# Patient Record
Sex: Male | Born: 1955 | Race: White | Hispanic: No | Marital: Married | Smoking: Never smoker
Health system: Southern US, Community
[De-identification: ages and names within clinical notes are randomized; demographics above are authoritative.]

## PROBLEM LIST (undated history)

## (undated) DIAGNOSIS — M1990 Unspecified osteoarthritis, unspecified site: Secondary | ICD-10-CM

## (undated) DIAGNOSIS — Z8719 Personal history of other diseases of the digestive system: Secondary | ICD-10-CM

## (undated) DIAGNOSIS — I10 Essential (primary) hypertension: Secondary | ICD-10-CM

## (undated) DIAGNOSIS — E785 Hyperlipidemia, unspecified: Secondary | ICD-10-CM

## (undated) DIAGNOSIS — K219 Gastro-esophageal reflux disease without esophagitis: Secondary | ICD-10-CM

## (undated) HISTORY — PX: NECK SURGERY: SHX720

## (undated) HISTORY — DX: Hyperlipidemia, unspecified: E78.5

## (undated) HISTORY — PX: FOOT SURGERY: SHX648

## (undated) HISTORY — PX: POLYPECTOMY: SHX149

## (undated) HISTORY — PX: COLONOSCOPY: SHX174

## (undated) HISTORY — PX: HERNIA REPAIR: SHX51

---

## 2000-03-01 ENCOUNTER — Emergency Department (HOSPITAL_COMMUNITY): Admission: EM | Admit: 2000-03-01 | Discharge: 2000-03-01 | Payer: Self-pay | Admitting: Emergency Medicine

## 2000-05-01 ENCOUNTER — Ambulatory Visit (HOSPITAL_COMMUNITY): Admission: RE | Admit: 2000-05-01 | Discharge: 2000-05-01 | Payer: Self-pay | Admitting: Neurosurgery

## 2000-05-01 ENCOUNTER — Encounter: Payer: Self-pay | Admitting: Neurosurgery

## 2000-05-16 ENCOUNTER — Encounter: Payer: Self-pay | Admitting: Neurosurgery

## 2000-05-16 ENCOUNTER — Ambulatory Visit (HOSPITAL_COMMUNITY): Admission: RE | Admit: 2000-05-16 | Discharge: 2000-05-16 | Payer: Self-pay | Admitting: Neurosurgery

## 2000-05-31 ENCOUNTER — Encounter: Payer: Self-pay | Admitting: Neurosurgery

## 2000-05-31 ENCOUNTER — Ambulatory Visit (HOSPITAL_COMMUNITY): Admission: RE | Admit: 2000-05-31 | Discharge: 2000-05-31 | Payer: Self-pay | Admitting: Neurosurgery

## 2005-09-27 ENCOUNTER — Ambulatory Visit (HOSPITAL_COMMUNITY): Admission: RE | Admit: 2005-09-27 | Discharge: 2005-09-27 | Payer: Self-pay | Admitting: Gastroenterology

## 2005-09-27 ENCOUNTER — Encounter (INDEPENDENT_AMBULATORY_CARE_PROVIDER_SITE_OTHER): Payer: Self-pay | Admitting: *Deleted

## 2009-08-27 ENCOUNTER — Encounter: Admission: RE | Admit: 2009-08-27 | Discharge: 2009-08-27 | Payer: Self-pay | Admitting: Internal Medicine

## 2009-09-17 ENCOUNTER — Ambulatory Visit (HOSPITAL_COMMUNITY): Admission: RE | Admit: 2009-09-17 | Discharge: 2009-09-17 | Payer: Self-pay | Admitting: Neurosurgery

## 2010-10-21 ENCOUNTER — Ambulatory Visit (HOSPITAL_COMMUNITY)
Admission: RE | Admit: 2010-10-21 | Discharge: 2010-10-21 | Disposition: A | Discharge status: Home / Self Care | Payer: Commercial Managed Care - PPO | Source: Ambulatory Visit | Attending: Gastroenterology | Admitting: Gastroenterology

## 2010-10-21 ENCOUNTER — Other Ambulatory Visit: Payer: Self-pay | Admitting: Gastroenterology

## 2010-10-21 DIAGNOSIS — D126 Benign neoplasm of colon, unspecified: Secondary | ICD-10-CM | POA: Insufficient documentation

## 2010-10-28 NOTE — Op Note (Signed)
  NAME:  [name], ECCHER NO.:  [id number]  MEDICAL RECORD NO.:  [id number]           PATIENT TYPE:  O  LOCATION:  WLEN                         FACILITY:  [facility]  PHYSICIAN:  Earle Gell, M.D.   DATE OF BIRTH:  1956/06/13  DATE OF PROCEDURE: DATE OF DISCHARGE:                              OPERATIVE REPORT   REFERRING PHYSICIAN:  Wenda Low, MD  HISTORY:  Mr. Erik Jones is a 55 year old male, born [birth year].  On September 27, 2005, the patient underwent his first screening colonoscopy and a diminutive neoplastic polyp was removed from his hepatic flexure and from the distal sigmoid colon.  The patient is scheduled to undergo a surveillance colonoscopy with polypectomy to prevent colon cancer.  ENDOSCOPIST:  Earle Gell, M.D.  PREMEDICATION:  Fentanyl 75 mcg, Versed 7 mg.  PROCEDURE:  After obtaining informed consent, the patient was placed in the left lateral decubitus position.  Anal inspection and digital rectal exam were normal.  The Pentax pediatric colonoscope was introduced into the rectum and easily advanced to the cecum.  A normal-appearing ileocecal valve and appendiceal orifice were identified.  Colonic preparation for the exam today was good.  Rectum normal.  Retroflexed view of the distal rectum normal. Sigmoid colon, normal. Descending colon, normal. Splenic flexure, normal. Transverse colon, normal. Hepatic flexure, normal. Ascending colon, normal. Cecum and ileocecal valve.  A 3-mm sessile polyp was removed from the cecum with cold biopsy forceps.  Assessment:  A small polyp was removed from the cecum with the cold biopsy forceps; otherwise normal surveillance colonoscopy.  RECOMMENDATIONS:  Repeat surveillance colonoscopy in 5 years.          ______________________________ Earle Gell, M.D.     MJ/MEDQ  D:  10/21/2010  T:  10/21/2010  Job:  [id number]  cc:   Wenda Low, MD Fax: [phone]  Electronically  Signed by Earle Gell M.D. on 10/28/2010 04:54:17 PM

## 2010-11-04 LAB — BASIC METABOLIC PANEL
BUN: 16 mg/dL (ref 6–23)
CO2: 30 mEq/L (ref 19–32)
Calcium: 9.5 mg/dL (ref 8.4–10.5)
Chloride: 99 mEq/L (ref 96–112)
Creatinine, Ser: 1.05 mg/dL (ref 0.4–1.5)
GFR calc Af Amer: >60 mL/min (ref >60)
GFR calc non Af Amer: >60 mL/min (ref >60)
Glucose, Bld: 118 mg/dL — ABNORMAL HIGH (ref 70–99)
Potassium: 4.4 mEq/L (ref 3.5–5.1)
Sodium: 137 mEq/L (ref 135–145)

## 2010-11-04 LAB — CBC
HCT: 44 % (ref 39.0–52.0)
Hemoglobin: 15.3 g/dL (ref 13.0–17.0)
MCHC: 34.8 g/dL (ref 30.0–36.0)
MCV: 93 fL (ref 78.0–100.0)
Platelets: 222 10*3/uL (ref 150–400)
RBC: 4.74 MIL/uL (ref 4.22–5.81)
RDW: 13 % (ref 11.5–15.5)
WBC: 7.1 10*3/uL (ref 4.0–10.5)

## 2010-12-31 NOTE — Op Note (Signed)
NAMELORA, Erik Jones NO.:  [id number]   MEDICAL RECORD NO.:  [id number]          PATIENT TYPE:  AMB   LOCATION:  ENDO                         FACILITY:  [facility]   PHYSICIAN:  Earle Gell, M.D.   DATE OF BIRTH:  [birth year]   DATE OF PROCEDURE:  09/27/2005  DATE OF DISCHARGE:                                 OPERATIVE REPORT   REFERRING PHYSICIAN:  Dr. Wenda Low   PROCEDURE:  Screening colonoscopy.   INDICATIONS:  Mr. Aariv Doerflein is a 55 year old male, born 04-14-1956.  Mr. Rama is scheduled to undergo his first screening colonoscopy  with polypectomy to prevent colon cancer.   ENDOSCOPIST:  Dr. Howell Rucks   PREMEDICATION:  1.  Versed 7 mg.  2.  Demerol 50 mg.   PROCEDURE:  After obtaining informed consent, Mr. Birke was placed in the  left lateral decubitus position.  I administered intravenous Demerol and  intravenous Versed to achieve conscious sedation for the procedure.  The  patient's blood pressure, oxygen saturation, and cardiac rhythm were  monitored throughout the procedure and documented in the medical record.   Anal inspection and digital rectal exam were normal.  The prostate was  nonnodular.  The Olympus adjustable pediatric colonoscope was introduced  into the rectum and advanced to the cecum.  A normal-appearing ileocecal  valve was intubated and the distal ileum inspected.  Colonic preparation for  the exam today was excellent.   Rectum normal.  I did not have enough room to perform a retroflexed view of  the distal rectum.  Sigmoid colon and descending colon.  From the distal sigmoid colon a 2 mm  sessile polyp was removed from a fold in the distal sigmoid colon.  Electrocautery snare was utilized.  Splenic flexure normal.  Transverse colon normal.  Hepatic flexure.  A 1-mm sessile polyp was removed with the cold biopsy  forceps.  Ascending colon normal.  Cecum and ileocecal valve normal.  Distal ileum normal.   ASSESSMENT:  A diminutive polyp was removed from the hepatic flexure with  the cold biopsy forceps; a 2-mm polyp was removed from the distal sigmoid  colon with the electrocautery snare.   RECOMMENDATIONS:  Await pathology report to determine when Mr. Trayer needs  a repeat screen for colon polyps.           ______________________________  Earle Gell, M.D.     MJ/MEDQ  D:  09/27/2005  T:  09/27/2005  Job:  [id number]   cc:   Wenda Low, MD  Fax: [phone]

## 2011-12-22 ENCOUNTER — Other Ambulatory Visit: Payer: Self-pay | Admitting: Internal Medicine

## 2011-12-23 ENCOUNTER — Ambulatory Visit
Admission: RE | Admit: 2011-12-23 | Discharge: 2011-12-23 | Disposition: A | Discharge status: Home / Self Care | Payer: Commercial Managed Care - PPO | Source: Ambulatory Visit | Attending: Internal Medicine | Admitting: Internal Medicine

## 2012-06-03 ENCOUNTER — Encounter (HOSPITAL_COMMUNITY): Payer: Self-pay | Admitting: Emergency Medicine

## 2012-06-03 ENCOUNTER — Emergency Department (INDEPENDENT_AMBULATORY_CARE_PROVIDER_SITE_OTHER)
Admission: EM | Admit: 2012-06-03 | Discharge: 2012-06-03 | Disposition: A | Discharge status: Home / Self Care | Payer: 59 | Source: Home / Self Care

## 2012-06-03 ENCOUNTER — Emergency Department (INDEPENDENT_AMBULATORY_CARE_PROVIDER_SITE_OTHER): Payer: 59

## 2012-06-03 DIAGNOSIS — IMO0002 Reserved for concepts with insufficient information to code with codable children: Secondary | ICD-10-CM

## 2012-06-03 HISTORY — DX: Essential (primary) hypertension: I10

## 2012-06-03 MED ORDER — CEPHALEXIN 500 MG PO CAPS: 500.0000 mg | ORAL_CAPSULE | Freq: Three times a day (TID) | ORAL | Status: DC

## 2012-06-03 MED ORDER — HYDROCODONE-ACETAMINOPHEN 5-325 MG PO TABS: 1.0000 | ORAL_TABLET | ORAL | Status: DC | PRN

## 2012-06-03 NOTE — Discharge Instructions (Signed)
Foreign Body  A foreign body is something in your body that should not be there. This may have been caused by a puncture wound or other injury. Puncture wounds become easily infected. This happens when bacteria (germs) get under the skin. Rusty nails and similar foreign bodies are often dirty and carry germs on them.   TREATMENT   · A foreign body is usually removed if this can be easily done right after it happens.  · Sometimes they are left in and removed at a later surgery. They may be left in indefinitely if they will not cause later problems.  · The following are general instructions in caring for your wound.  HOME CARE INSTRUCTIONS   · A dressing, depending on the location of the wound, may have been applied. This may be changed once per day or as instructed. If the dressing sticks, it may be soaked off with soapy water or hydrogen peroxide.  · Only take over-the-counter or prescription medicines for pain, discomfort, or fever as directed by your caregiver.  · Be aware that your body will work to remove the foreign substance. That is, the foreign body may work itself out of the wound. That is normal.  · You may have received a recommendation to follow up with your physician or a specialist. It is very important to call for or keep follow-up appointments in order to avoid infection or other complications.  SEEK IMMEDIATE MEDICAL CARE IF:   · There is redness, swelling, or increasing pain in the wound.  · You notice a foul smell coming from the wound or dressing.  · Pus is coming from the wound.  · An unexplained oral temperature above 102° F (38.9° C) develops, or as your caregiver suggests.  · There is increasing pain in the wound.  If you did not receive a tetanus shot today because you did not recall when your last one was given, check with your caregiver's office and determine if one is needed. Generally for a "dirty" wound, you should receive a tetanus booster if you have not had one in the last five  years. If you have a "clean" wound, you should receive a tetanus booster if you have not had one within the last ten years.  If you have a foreign body that needs removal and this was not done today, make sure you know how you are to follow up and what is the plan of action for taking care of this. It is your responsibility to follow up on this.  MAKE SURE YOU:   · Understand these instructions.  · Will watch your condition.  · Will get help right away if you are not doing well or get worse.  Document Released: 01/25/2001 Document Revised: 10/24/2011 Document Reviewed: 03/20/2008  ExitCare® Patient Information ©2013 [employer].

## 2012-06-03 NOTE — ED Notes (Signed)
Waiting discharge papers 

## 2012-06-03 NOTE — ED Provider Notes (Signed)
History     CSN: [id number]  Arrival date & time 06/03/12  1054   None     Chief Complaint  Patient presents with  . Foot Injury    (Consider location/radiation/quality/duration/timing/severity/associated sxs/prior treatment) HPI Comments: Today this 56 year old male stepped on a sewing needle broke off into his left foot, plantar aspect at the distal foot. Denies any other injury. The foreign body is not visible to the naked eye. There is no bleeding or discoloration.  Patient is a 56 y.o. male presenting with foot injury.  Foot Injury     Past Medical History  Diagnosis Date  . Hypertension     Past Surgical History  Procedure Date  . Hernia repair   . Neck surgery     History reviewed. No pertinent family history.  History  Substance Use Topics  . Smoking status: Never Smoker   . Smokeless tobacco: Not on file  . Alcohol Use: No      Review of Systems  Constitutional: Negative.   Respiratory: Negative.   Musculoskeletal: Negative for back pain and arthralgias.  Skin: Positive for wound. Negative for rash.  Neurological: Negative.     Allergies  Review of patient's allergies indicates no known allergies.  Home Medications   Current Outpatient Rx  Name Route Sig Dispense Refill  . AMLODIPINE-ATORVASTATIN 5-10 MG PO TABS Oral Take 1 tablet by mouth daily.    . ASPIRIN 81 MG PO TABS Oral Take 81 mg by mouth daily.    . CO Q-10 PO Oral Take by mouth.    . CEPHALEXIN 500 MG PO CAPS Oral Take 1 capsule (500 mg total) by mouth 3 (three) times daily. 12 capsule 0  . HYDROCODONE-ACETAMINOPHEN 5-325 MG PO TABS Oral Take 1 tablet by mouth every 4 (four) hours as needed for pain. 12 tablet 0    BP 179/100  Pulse 65  Temp 98.1 F (36.7 C) (Oral)  Resp 18  SpO2 100%  Physical Exam  Constitutional: He is oriented to person, place, and time. He appears well-developed and well-nourished.  Pulmonary/Chest: Effort normal. No respiratory distress.    Musculoskeletal: Normal range of motion. He exhibits no edema and no tenderness.  Neurological: He is alert and oriented to person, place, and time. No cranial nerve deficit.  Skin: Skin is warm and dry. No rash noted. No erythema. No pallor.       There is a barely palpable firmness in plantar aspect of the forefoot. This is the site of the needle stick.    ED Course  FOREIGN BODY REMOVAL Date/Time: 06/03/2012 3:08 PM Performed by: Marcha Dutton, Dheeraj Hail Authorized by: Marcha Dutton, Vernadine Coombs Consent: Verbal consent obtained. Risks and benefits: risks, benefits and alternatives were discussed Consent given by: patient Patient understanding: patient states understanding of the procedure being performed Patient identity confirmed: verbally with patient Anesthesia: see MAR for details Local anesthetic: lidocaine 2% without epinephrine Patient sedated: no Patient restrained: no Complexity: complex 0 objects recovered. Post-procedure assessment: foreign body not removed Comments: Using an #11 blade an overlying incision was made and with blunt mosquito forceps explored for the FB. After 20 minutes unable to lacate and Dr. Melanee Spry advised. He offered to assist and extended the incision and further exploration for the FB but to no avail. Using 2 Ethilon 3-0 sutures and a steri strip the wound was closed   (including critical care time)  Labs Reviewed - No data to display Dg Foot Complete Left  06/03/2012  *RADIOLOGY REPORT*  Clinical  Data: Foot injury  LEFT FOOT - COMPLETE 3+ VIEW  Comparison: None.  Findings: Three views of the left foot submitted.  There is a metallic needle plantar aspect soft tissue great toe adjacent to proximal phalanx measures 1.4 cm in length.  No acute fracture or subluxation.  Small plantar spur of the calcaneus.  IMPRESSION: There is a metallic needle plantar aspect soft tissue great toe adjacent to proximal phalanx measures 1.4 cm in length.  No acute fracture or subluxation.    Original Report Authenticated By: Lahoma Crocker, M.D.      1. Foreign body in foot or toe       MDM  45 minutes exploring for the FB needle but unable to locate.  Wil refer to surgeon to have it excised under flouro.  Keflex 500 tid for 4 days. Norco 5 q 4h prn pain. May also take OTC meds if preferred.        Janne Napoleon, NP 06/03/12 1515

## 2012-06-03 NOTE — ED Notes (Addendum)
Pt stepped on sewing needle. Pt states needle was stuck in carpet and he stepped on it and the end portion of the sewing needle broke off into his foot.  Needle is at the base of the big toe. A slight pain is felt with weight bearing.

## 2012-06-04 NOTE — ED Notes (Signed)
Patient called, had questions about referral

## 2012-06-04 NOTE — ED Provider Notes (Signed)
Medical screening examination/treatment/procedure(s) were performed by resident physician or non-physician practitioner and as supervising physician I was immediately available for consultation/collaboration.   Shloimy Michalski DOUGLAS MD.    Zitlali Primm D Isayah Ignasiak, MD 06/04/12 2048 

## 2016-03-02 ENCOUNTER — Other Ambulatory Visit: Payer: Self-pay | Admitting: Gastroenterology

## 2016-04-01 ENCOUNTER — Encounter (HOSPITAL_COMMUNITY): Payer: Self-pay | Admitting: *Deleted

## 2016-04-12 ENCOUNTER — Encounter (HOSPITAL_COMMUNITY): Payer: Self-pay

## 2016-04-12 ENCOUNTER — Encounter (HOSPITAL_COMMUNITY)
Admission: RE | Disposition: A | Discharge status: Home / Self Care | Payer: Self-pay | Source: Ambulatory Visit | Attending: Gastroenterology

## 2016-04-12 ENCOUNTER — Ambulatory Visit (HOSPITAL_COMMUNITY): Payer: 59 | Admitting: Anesthesiology

## 2016-04-12 ENCOUNTER — Ambulatory Visit (HOSPITAL_COMMUNITY)
Admission: RE | Admit: 2016-04-12 | Discharge: 2016-04-12 | Disposition: A | Discharge status: Home / Self Care | Payer: 59 | Source: Ambulatory Visit | Attending: Gastroenterology | Admitting: Gastroenterology

## 2016-04-12 DIAGNOSIS — Z79899 Other long term (current) drug therapy: Secondary | ICD-10-CM | POA: Insufficient documentation

## 2016-04-12 DIAGNOSIS — E7800 Pure hypercholesterolemia, unspecified: Secondary | ICD-10-CM | POA: Insufficient documentation

## 2016-04-12 DIAGNOSIS — Z7982 Long term (current) use of aspirin: Secondary | ICD-10-CM | POA: Insufficient documentation

## 2016-04-12 DIAGNOSIS — D120 Benign neoplasm of cecum: Secondary | ICD-10-CM | POA: Insufficient documentation

## 2016-04-12 DIAGNOSIS — D122 Benign neoplasm of ascending colon: Secondary | ICD-10-CM | POA: Diagnosis not present

## 2016-04-12 DIAGNOSIS — Z1211 Encounter for screening for malignant neoplasm of colon: Secondary | ICD-10-CM | POA: Diagnosis not present

## 2016-04-12 DIAGNOSIS — K219 Gastro-esophageal reflux disease without esophagitis: Secondary | ICD-10-CM | POA: Diagnosis not present

## 2016-04-12 DIAGNOSIS — M1990 Unspecified osteoarthritis, unspecified site: Secondary | ICD-10-CM | POA: Diagnosis not present

## 2016-04-12 DIAGNOSIS — I10 Essential (primary) hypertension: Secondary | ICD-10-CM | POA: Insufficient documentation

## 2016-04-12 DIAGNOSIS — Z86010 Personal history of colonic polyps: Secondary | ICD-10-CM | POA: Insufficient documentation

## 2016-04-12 HISTORY — DX: Personal history of other diseases of the digestive system: Z87.19

## 2016-04-12 HISTORY — PX: COLONOSCOPY WITH PROPOFOL: SHX5780

## 2016-04-12 HISTORY — DX: Unspecified osteoarthritis, unspecified site: M19.90

## 2016-04-12 HISTORY — DX: Gastro-esophageal reflux disease without esophagitis: K21.9

## 2016-04-12 SURGERY — COLONOSCOPY WITH PROPOFOL
Anesthesia: Monitor Anesthesia Care

## 2016-04-12 MED ORDER — LACTATED RINGERS IV SOLN
INTRAVENOUS | Status: DC | PRN
  Administered 2016-04-12: 11:00:00 via INTRAVENOUS

## 2016-04-12 MED ORDER — PROPOFOL 500 MG/50ML IV EMUL
INTRAVENOUS | Status: DC | PRN
  Administered 2016-04-12: 100 ug/kg/min via INTRAVENOUS

## 2016-04-12 MED ORDER — PROPOFOL 500 MG/50ML IV EMUL
INTRAVENOUS | Status: DC | PRN
  Administered 2016-04-12 (×2): 30 mg via INTRAVENOUS

## 2016-04-12 MED ORDER — EPHEDRINE SULFATE 50 MG/ML IJ SOLN
INTRAMUSCULAR | Status: DC | PRN
  Administered 2016-04-12: 10 mg via INTRAVENOUS

## 2016-04-12 SURGICAL SUPPLY — 22 items

## 2016-04-12 NOTE — Anesthesia Postprocedure Evaluation (Addendum)
Anesthesia Post Note  Patient: Erik Jones  Procedure(s) Performed: Procedure(s) (LRB): COLONOSCOPY WITH PROPOFOL (N/A)  Patient location during evaluation: PACU Anesthesia Type: MAC Level of consciousness: awake and alert Pain management: pain level controlled Vital Signs Assessment: post-procedure vital signs reviewed and stable Respiratory status: spontaneous breathing, nonlabored ventilation, respiratory function stable and patient connected to nasal cannula oxygen Cardiovascular status: stable and blood pressure returned to baseline Anesthetic complications: no    Last Vitals:  Vitals:   04/12/16 1108 04/12/16 1130  BP: (!) 97/35 130/64  Pulse: (!) 54   Resp: (!) 21   Temp: 36.3 C     Last Pain:  Vitals:   04/12/16 1108  TempSrc: Oral                 [name]

## 2016-04-12 NOTE — Op Note (Signed)
[facility] Patient Name: Erik Jones Procedure Date: 04/12/2016 MRN: [id number] Attending MD: Garlan Fair , MD Date of Birth: [birth year] CSN: [id number] Age: 60 Admit Type: Outpatient Procedure:                Colonoscopy Indications:              High risk colon cancer surveillance: Personal                            history of adenoma less than 10 mm in size Providers:                Garlan Fair, MD, Dustin Flock RN, RN, Elspeth Cho Tech., Technician, Herbie Drape, CRNA Referring MD:              Medicines:                Propofol per Anesthesia Complications:            No immediate complications. Estimated Blood Loss:     Estimated blood loss: none. Procedure:                Pre-Anesthesia Assessment:                           - Prior to the procedure, a History and Physical                            was performed, and patient medications and                            allergies were reviewed. The patient's tolerance of                            previous anesthesia was also reviewed. The risks                            and benefits of the procedure and the sedation                            options and risks were discussed with the patient.                            All questions were answered, and informed consent                            was obtained. Prior Anticoagulants: The patient has                            taken aspirin, last dose was day of procedure. ASA                            Grade Assessment: II - A patient with mild systemic  disease. After reviewing the risks and benefits,                            the patient was deemed in satisfactory condition to                            undergo the procedure.                           After obtaining informed consent, the colonoscope                            was passed under direct vision. Throughout the          procedure, the patient's blood pressure, pulse, and                            oxygen saturations were monitored continuously. The                            [id number]) scope was introduced through                            the anus and advanced to the the cecum, identified                            by appendiceal orifice and ileocecal valve. The                            colonoscopy was performed without difficulty. The                            patient tolerated the procedure well. The quality                            of the bowel preparation was good. The appendiceal                            orifice and the rectum were photographed. Scope In: 10:41:31 AM Scope Out: 10:58:15 AM Scope Withdrawal Time: 0 hours 12 minutes 27 seconds  Total Procedure Duration: 0 hours 16 minutes 44 seconds  Findings:      The perianal and digital rectal examinations were normal.      A 4 mm polyp was found in the cecum. The polyp was sessile. The polyp       was removed with a cold snare. Resection and retrieval were complete.      Two sessile polyps were found in the mid transverse colon. The polyps       were 4 mm in size. These polyps were removed with a cold snare.       Resection and retrieval were complete.      The exam was otherwise without abnormality. Impression:               - One 4 mm polyp in the cecum, removed with a cold  snare. Resected and retrieved.                           - Two 4 mm polyps in the mid transverse colon,                            removed with a cold snare. Resected and retrieved.                           - The examination was otherwise normal. Moderate Sedation:      N/A- Per Anesthesia Care Recommendation:           - Patient has a contact number available for                            emergencies. The signs and symptoms of potential                            delayed complications were discussed with the                             patient. Return to normal activities tomorrow.                            Written discharge instructions were provided to the                            patient.                           - Repeat colonoscopy in 5 years for surveillance.                           - Resume previous diet.                           - Continue present medications. Procedure Code(s):        --- Professional ---                           [id number], Colonoscopy, flexible; with removal of                            tumor(s), polyp(s), or other lesion(s) by snare                            technique Diagnosis Code(s):        --- Professional ---                           Z86.010, Personal history of colonic polyps                           D12.0, Benign neoplasm of cecum  D12.3, Benign neoplasm of transverse colon (hepatic                            flexure or splenic flexure) CPT copyright 2016 American Medical Association. All rights reserved. The codes documented in this report are preliminary and upon coder review may  be revised to meet current compliance requirements. Earle Gell, MD Garlan Fair, MD 04/12/2016 11:05:48 AM This report has been signed electronically. Number of Addenda: 0

## 2016-04-12 NOTE — Anesthesia Preprocedure Evaluation (Addendum)
Anesthesia Evaluation  Patient identified by MRN, date of birth, ID band Patient awake    Reviewed: Allergy & Precautions, NPO status , Patient's Chart, lab work & pertinent test results  Airway Mallampati: II  TM Distance: >3 FB Neck ROM: Full    Dental  (+) Teeth Intact, Dental Advisory Given   Pulmonary neg pulmonary ROS,    Pulmonary exam normal breath sounds clear to auscultation       Cardiovascular hypertension,  Rhythm:Regular Rate:Normal     Neuro/Psych negative neurological ROS  negative psych ROS   GI/Hepatic Neg liver ROS, hiatal hernia, GERD  ,  Endo/Other  negative endocrine ROS  Renal/GU negative Renal ROS  negative genitourinary   Musculoskeletal  (+) Arthritis ,   Abdominal   Peds negative pediatric ROS (+)  Hematology negative hematology ROS (+)   Anesthesia Other Findings   Reproductive/Obstetrics negative OB ROS                            Anesthesia Physical Anesthesia Plan  ASA: II  Anesthesia Plan: MAC   Post-op Pain Management:    Induction: Intravenous  Airway Management Planned: Simple Face Mask  Additional Equipment:   Intra-op Plan:   Post-operative Plan:   Informed Consent:   Plan Discussed with:   Anesthesia Plan Comments:         Anesthesia Quick Evaluation

## 2016-04-12 NOTE — Transfer of Care (Signed)
Immediate Anesthesia Transfer of Care Note  Patient: Erik Jones  Procedure(s) Performed: Procedure(s): COLONOSCOPY WITH PROPOFOL (N/A)  Patient Location: PACU  Anesthesia Type:MAC  Level of Consciousness: sedated, patient cooperative and responds to stimulation  Airway & Oxygen Therapy: Patient Spontanous Breathing and Patient connected to face mask oxygen  Post-op Assessment: Report given to RN and Post -op Vital signs reviewed and stable  Post vital signs: Reviewed and stable  Last Vitals:  Vitals:   04/12/16 1020  BP: (!) 196/89  Pulse: 61  Resp: 10  Temp: 36.8 C    Last Pain:  Vitals:   04/12/16 1020  TempSrc: Oral         Complications: No apparent anesthesia complications

## 2016-04-12 NOTE — H&P (Signed)
  Procedure: Surveillance colonoscopy. 10/21/2010 colonoscopy was performed with removal of a diminutive cecal tubular adenomatous polyp  History: The patient is a 60 year old male born 22-Apr-1956. He is scheduled to undergo a surveillance colonoscopy today.  Past medical history: Hypertension. Hypercholesterolemia. Chronic back pain. Microscopic hematuria. Right inguinal herniorrhaphy. Cervical laminectomy. Gastroesophageal reflux.  Exam: The patient is alert and lying comfortably on the endoscopy stretcher. Abdomen is soft and nontender to palpation. Lungs are clear to auscultation. Cardiac exam reveals a rhythm.  Plan: Proceed with surveillance colonoscopy

## 2016-04-12 NOTE — Discharge Instructions (Signed)

## 2016-04-13 ENCOUNTER — Encounter (HOSPITAL_COMMUNITY): Payer: Self-pay | Admitting: Gastroenterology

## 2017-02-08 DIAGNOSIS — E7800 Pure hypercholesterolemia, unspecified: Secondary | ICD-10-CM | POA: Diagnosis not present

## 2017-02-08 DIAGNOSIS — Z23 Encounter for immunization: Secondary | ICD-10-CM | POA: Diagnosis not present

## 2017-02-08 DIAGNOSIS — I10 Essential (primary) hypertension: Secondary | ICD-10-CM | POA: Diagnosis not present

## 2017-02-08 DIAGNOSIS — Z0000 Encounter for general adult medical examination without abnormal findings: Secondary | ICD-10-CM | POA: Diagnosis not present

## 2017-02-08 DIAGNOSIS — Z1389 Encounter for screening for other disorder: Secondary | ICD-10-CM | POA: Diagnosis not present

## 2017-02-08 DIAGNOSIS — K219 Gastro-esophageal reflux disease without esophagitis: Secondary | ICD-10-CM | POA: Diagnosis not present

## 2017-02-08 DIAGNOSIS — R7303 Prediabetes: Secondary | ICD-10-CM | POA: Diagnosis not present

## 2017-07-26 DIAGNOSIS — E7800 Pure hypercholesterolemia, unspecified: Secondary | ICD-10-CM | POA: Diagnosis not present

## 2017-07-26 DIAGNOSIS — I10 Essential (primary) hypertension: Secondary | ICD-10-CM | POA: Diagnosis not present

## 2017-07-26 DIAGNOSIS — R7303 Prediabetes: Secondary | ICD-10-CM | POA: Diagnosis not present

## 2018-01-25 DIAGNOSIS — Z0000 Encounter for general adult medical examination without abnormal findings: Secondary | ICD-10-CM | POA: Diagnosis not present

## 2018-01-25 DIAGNOSIS — R7303 Prediabetes: Secondary | ICD-10-CM | POA: Diagnosis not present

## 2018-01-25 DIAGNOSIS — E7800 Pure hypercholesterolemia, unspecified: Secondary | ICD-10-CM | POA: Diagnosis not present

## 2018-05-08 DIAGNOSIS — Z23 Encounter for immunization: Secondary | ICD-10-CM | POA: Diagnosis not present

## 2018-06-25 DIAGNOSIS — I788 Other diseases of capillaries: Secondary | ICD-10-CM | POA: Diagnosis not present

## 2018-06-25 DIAGNOSIS — L820 Inflamed seborrheic keratosis: Secondary | ICD-10-CM | POA: Diagnosis not present

## 2018-06-25 DIAGNOSIS — L578 Other skin changes due to chronic exposure to nonionizing radiation: Secondary | ICD-10-CM | POA: Diagnosis not present

## 2018-06-25 DIAGNOSIS — L821 Other seborrheic keratosis: Secondary | ICD-10-CM | POA: Diagnosis not present

## 2018-08-07 DIAGNOSIS — I10 Essential (primary) hypertension: Secondary | ICD-10-CM | POA: Diagnosis not present

## 2018-08-07 DIAGNOSIS — R7303 Prediabetes: Secondary | ICD-10-CM | POA: Diagnosis not present

## 2018-08-07 DIAGNOSIS — E7800 Pure hypercholesterolemia, unspecified: Secondary | ICD-10-CM | POA: Diagnosis not present

## 2019-02-07 DIAGNOSIS — E7800 Pure hypercholesterolemia, unspecified: Secondary | ICD-10-CM | POA: Diagnosis not present

## 2019-02-07 DIAGNOSIS — Z125 Encounter for screening for malignant neoplasm of prostate: Secondary | ICD-10-CM | POA: Diagnosis not present

## 2019-02-07 DIAGNOSIS — Z0000 Encounter for general adult medical examination without abnormal findings: Secondary | ICD-10-CM | POA: Diagnosis not present

## 2019-02-07 DIAGNOSIS — Z1389 Encounter for screening for other disorder: Secondary | ICD-10-CM | POA: Diagnosis not present

## 2019-02-07 DIAGNOSIS — R7303 Prediabetes: Secondary | ICD-10-CM | POA: Diagnosis not present

## 2019-07-30 DIAGNOSIS — E7800 Pure hypercholesterolemia, unspecified: Secondary | ICD-10-CM | POA: Diagnosis not present

## 2019-07-30 DIAGNOSIS — K219 Gastro-esophageal reflux disease without esophagitis: Secondary | ICD-10-CM | POA: Diagnosis not present

## 2019-07-30 DIAGNOSIS — I10 Essential (primary) hypertension: Secondary | ICD-10-CM | POA: Diagnosis not present

## 2019-07-30 DIAGNOSIS — R7303 Prediabetes: Secondary | ICD-10-CM | POA: Diagnosis not present

## 2019-08-03 DIAGNOSIS — Z20828 Contact with and (suspected) exposure to other viral communicable diseases: Secondary | ICD-10-CM | POA: Diagnosis not present

## 2019-08-03 DIAGNOSIS — Z9189 Other specified personal risk factors, not elsewhere classified: Secondary | ICD-10-CM | POA: Diagnosis not present

## 2020-01-16 DIAGNOSIS — T1502XD Foreign body in cornea, left eye, subsequent encounter: Secondary | ICD-10-CM | POA: Diagnosis not present

## 2020-03-24 DIAGNOSIS — Z125 Encounter for screening for malignant neoplasm of prostate: Secondary | ICD-10-CM | POA: Diagnosis not present

## 2020-03-24 DIAGNOSIS — I10 Essential (primary) hypertension: Secondary | ICD-10-CM | POA: Diagnosis not present

## 2020-03-24 DIAGNOSIS — Z0000 Encounter for general adult medical examination without abnormal findings: Secondary | ICD-10-CM | POA: Diagnosis not present

## 2020-03-24 DIAGNOSIS — R7303 Prediabetes: Secondary | ICD-10-CM | POA: Diagnosis not present

## 2020-03-24 DIAGNOSIS — E7800 Pure hypercholesterolemia, unspecified: Secondary | ICD-10-CM | POA: Diagnosis not present

## 2020-03-24 DIAGNOSIS — Z23 Encounter for immunization: Secondary | ICD-10-CM | POA: Diagnosis not present

## 2020-09-25 DIAGNOSIS — K219 Gastro-esophageal reflux disease without esophagitis: Secondary | ICD-10-CM | POA: Diagnosis not present

## 2020-09-25 DIAGNOSIS — Z23 Encounter for immunization: Secondary | ICD-10-CM | POA: Diagnosis not present

## 2020-09-25 DIAGNOSIS — E7800 Pure hypercholesterolemia, unspecified: Secondary | ICD-10-CM | POA: Diagnosis not present

## 2020-09-25 DIAGNOSIS — R7303 Prediabetes: Secondary | ICD-10-CM | POA: Diagnosis not present

## 2020-09-25 DIAGNOSIS — I10 Essential (primary) hypertension: Secondary | ICD-10-CM | POA: Diagnosis not present

## 2021-02-06 ENCOUNTER — Emergency Department (HOSPITAL_BASED_OUTPATIENT_CLINIC_OR_DEPARTMENT_OTHER): Payer: BC Managed Care – PPO

## 2021-02-06 ENCOUNTER — Emergency Department (HOSPITAL_BASED_OUTPATIENT_CLINIC_OR_DEPARTMENT_OTHER)
Admission: EM | Admit: 2021-02-06 | Discharge: 2021-02-06 | Disposition: A | Discharge status: Home / Self Care | Payer: BC Managed Care – PPO | Attending: Emergency Medicine | Admitting: Emergency Medicine

## 2021-02-06 ENCOUNTER — Other Ambulatory Visit: Payer: Self-pay

## 2021-02-06 ENCOUNTER — Encounter (HOSPITAL_BASED_OUTPATIENT_CLINIC_OR_DEPARTMENT_OTHER): Payer: Self-pay

## 2021-02-06 DIAGNOSIS — S0990XA Unspecified injury of head, initial encounter: Secondary | ICD-10-CM | POA: Insufficient documentation

## 2021-02-06 DIAGNOSIS — I10 Essential (primary) hypertension: Secondary | ICD-10-CM | POA: Insufficient documentation

## 2021-02-06 DIAGNOSIS — S0591XA Unspecified injury of right eye and orbit, initial encounter: Secondary | ICD-10-CM | POA: Diagnosis not present

## 2021-02-06 DIAGNOSIS — Z23 Encounter for immunization: Secondary | ICD-10-CM | POA: Insufficient documentation

## 2021-02-06 DIAGNOSIS — S01111A Laceration without foreign body of right eyelid and periocular area, initial encounter: Secondary | ICD-10-CM | POA: Diagnosis not present

## 2021-02-06 DIAGNOSIS — Z7982 Long term (current) use of aspirin: Secondary | ICD-10-CM | POA: Diagnosis not present

## 2021-02-06 DIAGNOSIS — M4802 Spinal stenosis, cervical region: Secondary | ICD-10-CM | POA: Diagnosis not present

## 2021-02-06 DIAGNOSIS — Z79899 Other long term (current) drug therapy: Secondary | ICD-10-CM | POA: Diagnosis not present

## 2021-02-06 DIAGNOSIS — Z043 Encounter for examination and observation following other accident: Secondary | ICD-10-CM | POA: Diagnosis not present

## 2021-02-06 DIAGNOSIS — W228XXA Striking against or struck by other objects, initial encounter: Secondary | ICD-10-CM | POA: Insufficient documentation

## 2021-02-06 DIAGNOSIS — S0181XA Laceration without foreign body of other part of head, initial encounter: Secondary | ICD-10-CM

## 2021-02-06 MED ORDER — LIDOCAINE-EPINEPHRINE 2 %-1:100000 IJ SOLN: 20.0000 mL | Freq: Once | INTRAMUSCULAR | Status: DC

## 2021-02-06 MED ORDER — TETANUS-DIPHTH-ACELL PERTUSSIS 5-2.5-18.5 LF-MCG/0.5 IM SUSY
0.5000 mL | PREFILLED_SYRINGE | Freq: Once | INTRAMUSCULAR | Status: AC
  Administered 2021-02-06: 0.5 mL via INTRAMUSCULAR
  Filled 2021-02-06: qty 0.5

## 2021-02-06 MED ORDER — LIDOCAINE-EPINEPHRINE-TETRACAINE (LET) TOPICAL GEL
3.0000 mL | Freq: Once | TOPICAL | Status: AC
  Administered 2021-02-06: 3 mL via TOPICAL
  Filled 2021-02-06: qty 3

## 2021-02-06 NOTE — ED Provider Notes (Signed)
[facility] EMERGENCY DEPT Provider Note   CSN: [id number] Arrival date & time: 02/06/21  1025     History Chief Complaint  Patient presents with   Head Laceration    Erik Jones is a 65 y.o. male.  The history is provided by the patient.  Head Laceration Erik Jones is a 65 y.o. male who presents to the Emergency Department complaining of head injury. He presents the emergency department after getting struck in the forehead by a garage door spring this morning. He states that it made his head. He did not fall. He did experience transient neck pain and left arm paresthesias, which rapidly resolved. He does have a history of cervical spine surgery and remote history of paresthesias to the left upper extremity.     Past Medical History:  Diagnosis Date   Arthritis    arthritis -hands   GERD (gastroesophageal reflux disease)    History of hiatal hernia    Hypertension     There are no problems to display for this patient.   Past Surgical History:  Procedure Laterality Date   COLONOSCOPY WITH PROPOFOL N/A 04/12/2016   Procedure: COLONOSCOPY WITH PROPOFOL;  Surgeon: Garlan Fair, MD;  Location: [facility];  Service: Endoscopy;  Laterality: N/A;   FOOT SURGERY Left    removal foreign body"needle"   HERNIA REPAIR Bilateral    80's   NECK SURGERY     laminectomy       No family history on file.  Social History   Tobacco Use   Smoking status: Never   Smokeless tobacco: Never  Substance Use Topics   Alcohol use: No   Drug use: No    Home Medications Prior to Admission medications   Medication Sig Start Date End Date Taking? Authorizing Provider  amLODipine (NORVASC) 10 MG tablet Take 10 mg by mouth daily.   Yes [provider]  atorvastatin (LIPITOR) 20 MG tablet Take 20 mg by mouth daily.   Yes [provider]  losartan (COZAAR) 25 MG tablet Take 25 mg by mouth every morning.   Yes [provider]   omeprazole (PRILOSEC) 20 MG capsule Take 20 mg by mouth at bedtime.   Yes [provider]  amLODipine-atorvastatin (CADUET) 5-10 MG per tablet Take 1 tablet by mouth at bedtime.     [provider]  aspirin 81 MG tablet Take 81 mg by mouth at bedtime.     [provider]    Allergies    Patient has no known allergies.  Review of Systems   Review of Systems  All other systems reviewed and are negative.  Physical Exam Updated Vital Signs BP (!) 177/77 (BP Location: Left Arm)   Pulse 64   Temp 97.9 F (36.6 C) (Oral)   Resp 16   SpO2 95%   Physical Exam Vitals and nursing note reviewed.  Constitutional:      Appearance: He is well-developed.  HENT:     Head: Normocephalic.     Comments: There is a laceration through the medial aspect of the right eyebrow, extending to the forehead. Pupils equal round and reactive, EOM I. Cardiovascular:     Rate and Rhythm: Normal rate and regular rhythm.     Heart sounds: No murmur heard. Pulmonary:     Effort: Pulmonary effort is normal. No respiratory distress.     Breath sounds: Normal breath sounds.  Abdominal:     Palpations: Abdomen is soft.  Tenderness: There is no abdominal tenderness. There is no guarding or rebound.  Musculoskeletal:        General: No tenderness.  Skin:    General: Skin is warm and dry.  Neurological:     Mental Status: He is alert and oriented to person, place, and time.     Comments: Five out of five strength in all four extremities with sensation to light touch intact in all four extremities  Psychiatric:        Behavior: Behavior normal.    ED Results / Procedures / Treatments   Labs (all labs ordered are listed, but only abnormal results are displayed) Labs Reviewed - No data to display  EKG None  Radiology CT Head Wo Contrast  Result Date: 02/06/2021 CLINICAL DATA:  Head trauma. Hit in the head by a garage door spring. Transient left upper extremity paresthesia.  EXAM: CT HEAD WITHOUT CONTRAST CT CERVICAL SPINE WITHOUT CONTRAST TECHNIQUE: Multidetector CT imaging of the head and cervical spine was performed following the standard protocol without intravenous contrast. Multiplanar CT image reconstructions of the cervical spine were also generated. COMPARISON:  Cervical spine MRI 08/27/2009 FINDINGS: CT HEAD FINDINGS Brain: There is no evidence of an acute infarct, intracranial hemorrhage, mass, midline shift, or extra-axial fluid collection. The ventricles and sulci are normal. Vascular: Calcified atherosclerosis at the skull base. Skull: No fracture or suspicious osseous lesion. Sinuses/Orbits: Visualized paranasal sinuses and mastoid air cells are clear. Unremarkable orbits. Other: Laceration in the right supraorbital soft tissues with mild swelling. CT CERVICAL SPINE FINDINGS Alignment: Straightening of the normal cervical lordosis. Trace anterolisthesis of C2 on C3. Skull base and vertebrae: No acute fracture or suspicious osseous lesion. Soft tissues and spinal canal: No prevertebral fluid or swelling. No visible canal hematoma. Disc levels: Moderate disc space narrowing at C5-6 and severe narrowing at C6-7 with degenerative endplate changes, progressed from the 2011 MRI. Asymmetrically severe left neural foraminal stenosis at C5-6 and C6-7 due to uncovertebral spurring with suspected moderate spinal stenosis at both levels as well. Upper chest: Clear lung apices. Other: Carotid atherosclerosis. IMPRESSION: 1. No evidence of acute intracranial abnormality. 2. Right supraorbital laceration. 3. No acute cervical spine fracture. 4. Progressive disc degeneration at C5-6 and C6-7 with asymmetrically severe left neural foraminal stenosis and likely moderate spinal stenosis at both levels. Electronically Signed   By: Logan Bores M.D.   On: 02/06/2021 11:46   CT Cervical Spine Wo Contrast  Result Date: 02/06/2021 CLINICAL DATA:  Head trauma. Hit in the head by a garage door  spring. Transient left upper extremity paresthesia. EXAM: CT HEAD WITHOUT CONTRAST CT CERVICAL SPINE WITHOUT CONTRAST TECHNIQUE: Multidetector CT imaging of the head and cervical spine was performed following the standard protocol without intravenous contrast. Multiplanar CT image reconstructions of the cervical spine were also generated. COMPARISON:  Cervical spine MRI 08/27/2009 FINDINGS: CT HEAD FINDINGS Brain: There is no evidence of an acute infarct, intracranial hemorrhage, mass, midline shift, or extra-axial fluid collection. The ventricles and sulci are normal. Vascular: Calcified atherosclerosis at the skull base. Skull: No fracture or suspicious osseous lesion. Sinuses/Orbits: Visualized paranasal sinuses and mastoid air cells are clear. Unremarkable orbits. Other: Laceration in the right supraorbital soft tissues with mild swelling. CT CERVICAL SPINE FINDINGS Alignment: Straightening of the normal cervical lordosis. Trace anterolisthesis of C2 on C3. Skull base and vertebrae: No acute fracture or suspicious osseous lesion. Soft tissues and spinal canal: No prevertebral fluid or swelling. No visible canal hematoma. Disc levels:  Moderate disc space narrowing at C5-6 and severe narrowing at C6-7 with degenerative endplate changes, progressed from the 2011 MRI. Asymmetrically severe left neural foraminal stenosis at C5-6 and C6-7 due to uncovertebral spurring with suspected moderate spinal stenosis at both levels as well. Upper chest: Clear lung apices. Other: Carotid atherosclerosis. IMPRESSION: 1. No evidence of acute intracranial abnormality. 2. Right supraorbital laceration. 3. No acute cervical spine fracture. 4. Progressive disc degeneration at C5-6 and C6-7 with asymmetrically severe left neural foraminal stenosis and likely moderate spinal stenosis at both levels. Electronically Signed   By: Logan Bores M.D.   On: 02/06/2021 11:46    Procedures .Marland KitchenLaceration Repair  Date/Time: 02/06/2021 12:00  PM Performed by: Quintella Reichert, MD Authorized by: Quintella Reichert, MD   Consent:    Consent obtained:  Verbal   Consent given by:  Patient   Risks discussed:  Infection and pain Universal protocol:    Patient identity confirmed:  Verbally with patient Anesthesia:    Anesthesia method:  Topical application and local infiltration   Topical anesthetic:  LET   Local anesthetic:  Lidocaine 2% w/o epi Laceration details:    Location:  Face   Face location:  R eyebrow   Length (cm):  3 Pre-procedure details:    Preparation:  Patient was prepped and draped in usual sterile fashion Exploration:    Imaging outcome: foreign body not noted   Treatment:    Area cleansed with:  Povidone-iodine and saline   Amount of cleaning:  Standard Skin repair:    Repair method:  Sutures   Suture size:  5-0   Suture material:  Prolene   Suture technique:  Simple interrupted   Number of sutures:  4 Approximation:    Approximation:  Close Repair type:    Repair type:  Simple Post-procedure details:    Dressing:  Antibiotic ointment   Procedure completion:  Tolerated   Medications Ordered in ED Medications  lidocaine-EPINEPHrine (XYLOCAINE W/EPI) 2 %-1:100000 (with pres) injection 20 mL (has no administration in time range)  Tdap (BOOSTRIX) injection 0.5 mL (has no administration in time range)  lidocaine-EPINEPHrine-tetracaine (LET) topical gel (3 mLs Topical Given 02/06/21 1111)    ED Course  I have reviewed the triage vital signs and the nursing notes.  Pertinent labs & imaging results that were available during my care of the patient were reviewed by me and considered in my medical decision making (see chart for details).    MDM Rules/Calculators/A&P                         patient here for evaluation of facial laceration after getting struck in the head by a garage spring. Wound repaired per procedure note. No evidence of eye involvement. 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 CT scans were obtained, which demonstrate chronic process, no evidence of acute fracture or serious intracranial abnormality. Discussed with patient local wound care as well as outpatient follow-up and return precautions.   Final Clinical Impression(s) / ED Diagnoses Final diagnoses:  Facial laceration, initial encounter    Rx / DC Orders ED Discharge Orders     None        Quintella Reichert, MD 02/06/21 1202

## 2021-02-06 NOTE — ED Triage Notes (Signed)
He states that, just p.t.a. he was in his workshop and was effecting repairs to a garage door, when a heavy-duty spring "let go" and struck him in the head. He has a lac. Through the medial right eyebrow. He states that when he was struck by the spring, he felt a momentary flash of neck pain and left arm numbness, both of which have completely resolved. He mentions having hx of cervical surgery to alleviate left arm paresthesias "years ago". He is alert and oriented x 4 with clear speech.

## 2021-02-12 DIAGNOSIS — Z4802 Encounter for removal of sutures: Secondary | ICD-10-CM | POA: Diagnosis not present

## 2021-03-25 DIAGNOSIS — Z5181 Encounter for therapeutic drug level monitoring: Secondary | ICD-10-CM | POA: Diagnosis not present

## 2021-03-25 DIAGNOSIS — R7303 Prediabetes: Secondary | ICD-10-CM | POA: Diagnosis not present

## 2021-03-25 DIAGNOSIS — Z0000 Encounter for general adult medical examination without abnormal findings: Secondary | ICD-10-CM | POA: Diagnosis not present

## 2021-03-25 DIAGNOSIS — E7800 Pure hypercholesterolemia, unspecified: Secondary | ICD-10-CM | POA: Diagnosis not present

## 2021-03-25 DIAGNOSIS — Z23 Encounter for immunization: Secondary | ICD-10-CM | POA: Diagnosis not present

## 2021-03-25 DIAGNOSIS — I10 Essential (primary) hypertension: Secondary | ICD-10-CM | POA: Diagnosis not present

## 2021-03-25 DIAGNOSIS — Z125 Encounter for screening for malignant neoplasm of prostate: Secondary | ICD-10-CM | POA: Diagnosis not present

## 2021-04-22 ENCOUNTER — Telehealth: Payer: Self-pay | Admitting: Gastroenterology

## 2021-04-22 ENCOUNTER — Encounter: Payer: Self-pay | Admitting: Gastroenterology

## 2021-04-22 NOTE — Telephone Encounter (Signed)
Thanks for update. GM 

## 2021-04-22 NOTE — Telephone Encounter (Signed)
Patient has been schedule at [facility]/2020.

## 2021-04-22 NOTE — Telephone Encounter (Signed)
Hi Dr. Rush Landmark,   D.O.D   We received a referral for patient to have a repeat colonoscopy. Had one done at [facility] about 5 years ago. Reports are available in Epic for you to review and advise on scheduling.  Thank you

## 2021-04-22 NOTE — Telephone Encounter (Signed)
Thank you for sending this to me. 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 He is due based on prior guidelines and will benefit from updated guidelines after his upcoming procedure. I am not sure as to why the patient had his procedures in the hospital (?cervical laminectomy in the past) but I do not see a difficult airway. I will forward this to CRNA Nulty to help ensure whether this patient can be done in the [place] vs needing to be done in the hospital. If no issues found, then he can proceed with Colonoscopy. I am fine in either case to have this direct procedure (even if he ends up needing to be on my Hospital case load). Thank you. GM  JN, can you see if you see a contraindication to being done in the [place]?  Thanks.

## 2021-05-04 ENCOUNTER — Encounter: Payer: Self-pay | Admitting: Gastroenterology

## 2021-05-04 ENCOUNTER — Other Ambulatory Visit: Payer: Self-pay

## 2021-05-04 ENCOUNTER — Ambulatory Visit (AMBULATORY_SURGERY_CENTER): Payer: BC Managed Care – PPO | Admitting: *Deleted

## 2021-05-04 VITALS — Ht 67.0 in | Wt 184.0 lb

## 2021-05-04 DIAGNOSIS — Z86010 Personal history of colonic polyps: Secondary | ICD-10-CM

## 2021-05-04 MED ORDER — PEG 3350-KCL-NA BICARB-NACL 420 G PO SOLR: 4000.0000 mL | Freq: Once | ORAL | 0 refills | Status: AC

## 2021-05-04 NOTE — Progress Notes (Signed)
No egg or soy allergy known to patient  No issues known to pt with past sedation with any surgeries or procedures Patient denies ever being told they had issues or difficulty with intubation  No FH of Malignant Hyperthermia Pt is not on diet pills Pt is not on  home 02  Pt is not on blood thinners  Pt denies issues with constipation  No A fib or A flutter  EMMI video to pt or via [place] 19 guidelines implemented in PV today with Pt and RN   Pt is fully vaccinated  for Covid   Due to the COVID-19 pandemic we are asking patients to follow certain guidelines.  Pt aware of COVID protocols and [facility] guidelines    Pt verified name, DOB, address and insurance during PV today.  Pt mailed instruction packet of Emmi video, copy of consent form to read and not return, and instructions. Pt encouraged to call with questions or issues.  My Chart instructions to pt as well

## 2021-05-18 ENCOUNTER — Encounter: Payer: Self-pay | Admitting: Gastroenterology

## 2021-05-18 ENCOUNTER — Ambulatory Visit (AMBULATORY_SURGERY_CENTER): Payer: BC Managed Care – PPO | Admitting: Gastroenterology

## 2021-05-18 ENCOUNTER — Other Ambulatory Visit: Payer: Self-pay

## 2021-05-18 VITALS — BP 120/72 | HR 53 | Temp 98.9°F | Resp 17 | Ht 67.0 in | Wt 184.0 lb

## 2021-05-18 DIAGNOSIS — D127 Benign neoplasm of rectosigmoid junction: Secondary | ICD-10-CM | POA: Diagnosis not present

## 2021-05-18 DIAGNOSIS — D124 Benign neoplasm of descending colon: Secondary | ICD-10-CM | POA: Diagnosis not present

## 2021-05-18 DIAGNOSIS — D122 Benign neoplasm of ascending colon: Secondary | ICD-10-CM | POA: Diagnosis not present

## 2021-05-18 DIAGNOSIS — Z86010 Personal history of colonic polyps: Secondary | ICD-10-CM

## 2021-05-18 DIAGNOSIS — Z1211 Encounter for screening for malignant neoplasm of colon: Secondary | ICD-10-CM | POA: Diagnosis not present

## 2021-05-18 MED ORDER — SODIUM CHLORIDE 0.9 % IV SOLN: 500.0000 mL | Freq: Once | INTRAVENOUS | Status: DC

## 2021-05-18 NOTE — Progress Notes (Signed)
GASTROENTEROLOGY PROCEDURE H&P NOTE   Primary Care Physician: Wenda Low, MD  HPI: Erik Jones is a 65 y.o. male who presents for Colonoscopy for surveillance of prior TAs.  Past Medical History:  Diagnosis Date   Arthritis    arthritis -hands   GERD (gastroesophageal reflux disease)    History of hiatal hernia    Hyperlipidemia    Hypertension    Past Surgical History:  Procedure Laterality Date   COLONOSCOPY     COLONOSCOPY WITH PROPOFOL N/A 04/12/2016   Procedure: COLONOSCOPY WITH PROPOFOL;  Surgeon: Garlan Fair, MD;  Location: [facility];  Service: Endoscopy;  Laterality: N/A;   FOOT SURGERY Left    removal foreign body"needle"   HERNIA REPAIR Bilateral    80's   NECK SURGERY     laminectomy   POLYPECTOMY     Current Outpatient Medications  Medication Sig Dispense Refill   amLODipine (NORVASC) 5 MG tablet Take 5 mg by mouth daily.     atorvastatin (LIPITOR) 10 MG tablet Take 10 mg by mouth daily.     co-enzyme Q-10 30 MG capsule Take 30 mg by mouth 3 (three) times daily.     losartan (COZAAR) 25 MG tablet Take 25 mg by mouth every morning.     Melatonin 12 MG TABS Take by mouth.     omeprazole (PRILOSEC) 20 MG capsule Take 20 mg by mouth at bedtime.     TURMERIC PO Take by mouth.     Current Facility-Administered Medications  Medication Dose Route Frequency Provider Last Rate Last Admin   0.9 %  sodium chloride infusion  500 mL Intravenous Once Mansouraty, Telford Nab., MD        Current Outpatient Medications:    amLODipine (NORVASC) 5 MG tablet, Take 5 mg by mouth daily., Disp: , Rfl:    atorvastatin (LIPITOR) 10 MG tablet, Take 10 mg by mouth daily., Disp: , Rfl:    co-enzyme Q-10 30 MG capsule, Take 30 mg by mouth 3 (three) times daily., Disp: , Rfl:    losartan (COZAAR) 25 MG tablet, Take 25 mg by mouth every morning., Disp: , Rfl:    Melatonin 12 MG TABS, Take by mouth., Disp: , Rfl:    omeprazole (PRILOSEC) 20 MG capsule, Take 20 mg by  mouth at bedtime., Disp: , Rfl:    TURMERIC PO, Take by mouth., Disp: , Rfl:   Current Facility-Administered Medications:    0.9 %  sodium chloride infusion, 500 mL, Intravenous, Once, Mansouraty, Telford Nab., MD No Known Allergies Family History  Problem Relation Age of Onset   Colon polyps Brother    Colon cancer Neg Hx    Esophageal cancer Neg Hx    Rectal cancer Neg Hx    Stomach cancer Neg Hx    Social History   Socioeconomic History   Marital status: Married    Spouse name: Not on file   Number of children: Not on file   Years of education: Not on file   Highest education level: Not on file  Occupational History   Not on file  Tobacco Use   Smoking status: Never   Smokeless tobacco: Never  Substance and Sexual Activity   Alcohol use: No   Drug use: No   Sexual activity: Yes  Other Topics Concern   Not on file  Social History Narrative   Not on file   Social Determinants of Health   Financial Resource Strain: Not on file  Food  Insecurity: Not on file  Transportation Needs: Not on file  Physical Activity: Not on file  Stress: Not on file  Social Connections: Not on file  Intimate Partner Violence: Not on file    Physical Exam: Today's Vitals   05/18/21 0814  BP: (!) 115/50  Pulse: (!) 58  Temp: 98.9 F (37.2 C)  SpO2: 97%  Weight: 184 lb (83.5 kg)  Height: 5\' 7"  (1.702 m)   Body mass index is 28.82 kg/m. GEN: NAD EYE: Sclerae anicteric ENT: MMM CV: Non-tachycardic GI: Soft, NT/ND NEURO:  Alert & Oriented x 3  Lab Results: No results for input(s): WBC, HGB, HCT, PLT in the last 72 hours. BMET No results for input(s): NA, K, CL, CO2, GLUCOSE, BUN, CREATININE, CALCIUM in the last 72 hours. LFT No results for input(s): PROT, ALBUMIN, AST, ALT, ALKPHOS, BILITOT, BILIDIR, IBILI in the last 72 hours. PT/INR No results for input(s): LABPROT, INR in the last 72 hours.   Impression / Plan: This is a 65 y.o.male who presents for Colonoscopy for  history of prior TAs.  The risks and benefits of endoscopic evaluation/treatment were discussed with the patient and/or family; these include but are not limited to the risk of perforation, infection, bleeding, missed lesions, lack of diagnosis, severe illness requiring hospitalization, as well as anesthesia and sedation related illnesses.  The patient's history has been reviewed, patient examined, no change in status, and deemed stable for procedure.  The patient and/or family is agreeable to proceed.    [name], MD [facility] Advanced [facility]oscopy Office # [phone]

## 2021-05-18 NOTE — Progress Notes (Signed)
Sedate, gd SR, tolerated procedure well, VSS, report to RN 

## 2021-05-18 NOTE — Op Note (Signed)
[facility] Patient Name: Erik Jones Procedure Date: 05/18/2021 9:07 AM MRN: [id number] Endoscopist: [name] , MD Age: 65 Referring MD:  Date of Birth: 1956-01-31 Gender: Male Account #: [id number] Procedure:                Colonoscopy Indications:              Surveillance: Personal history of adenomatous                            polyps on last colonoscopy 5 years ago Medicines:                Monitored Anesthesia Care Procedure:                Pre-Anesthesia Assessment:                           - Prior to the procedure, a History and Physical                            was performed, and patient medications and                            allergies were reviewed. The patient's tolerance of                            previous anesthesia was also reviewed. The risks                            and benefits of the procedure and the sedation                            options and risks were discussed with the patient.                            All questions were answered, and informed consent                            was obtained. Prior Anticoagulants: The patient has                            taken no previous anticoagulant or antiplatelet                            agents. ASA Grade Assessment: II - A patient with                            mild systemic disease. After reviewing the risks                            and benefits, the patient was deemed in                            satisfactory condition to undergo the procedure.  After obtaining informed consent, the colonoscope                            was passed under direct vision. Throughout the                            procedure, the patient's blood pressure, pulse, and                            oxygen saturations were monitored continuously. The                            Olympus CF-HQ190L (Serial# [id number]) Colonoscope was                            introduced through the  anus and advanced to the 5                            cm into the ileum. The colonoscopy was performed                            without difficulty. The patient tolerated the                            procedure. The quality of the bowel preparation was                            good. The terminal ileum, ileocecal valve,                            appendiceal orifice, and rectum were photographed. Scope In: 9:20:03 AM Scope Out: 9:35:43 AM Scope Withdrawal Time: 0 hours 14 minutes 2 seconds  Total Procedure Duration: 0 hours 15 minutes 40 seconds  Findings:                 The digital rectal exam findings include                            hemorrhoids. Pertinent negatives include no                            palpable rectal lesions.                           The terminal ileum and ileocecal valve appeared                            normal.                           Three sessile polyps were found in the                            recto-sigmoid colon (1), descending colon (1), and  ascending colon (1). The polyps were 2 to 5 mm in                            size. These polyps were removed with a cold snare.                            Resection and retrieval were complete.                           Multiple small-mouthed diverticula were found in                            the recto-sigmoid colon, sigmoid colon, transverse                            colon and hepatic flexure.                           Normal mucosa was found in the entire colon                            otherwise.                           Non-bleeding non-thrombosed external and internal                            hemorrhoids were found during retroflexion, during                            perianal exam and during digital exam. The                            hemorrhoids were Grade II (internal hemorrhoids                            that prolapse but reduce spontaneously). Complications:             No immediate complications. Estimated Blood Loss:     Estimated blood loss was minimal. Impression:               - Hemorrhoids found on digital rectal exam.                           - The examined portion of the ileum was normal.                           - Three, 2 to 5 mm polyps at the recto-sigmoid                            colon, in the descending colon and in the ascending                            colon, removed with a cold snare. Resected and  retrieved.                           - Diverticulosis in the recto-sigmoid colon, in the                            sigmoid colon, in the transverse colon and at the                            hepatic flexure.                           - Normal mucosa in the entire examined colon                            otherwise.                           - Non-bleeding non-thrombosed external and internal                            hemorrhoids. Recommendation:           - The patient will be observed post-procedure,                            until all discharge criteria are met.                           - Discharge patient to home.                           - Patient has a contact number available for                            emergencies. The signs and symptoms of potential                            delayed complications were discussed with the                            patient. Return to normal activities tomorrow.                            Written discharge instructions were provided to the                            patient.                           - High fiber diet.                           - Use FiberCon 1-2 tablets PO daily.                           - Continue present medications.                           -  Await pathology results.                           - Repeat colonoscopy in 3/5/7 years for                            surveillance based on pathology results.                           - The  findings and recommendations were discussed                            with the patient.                           - The findings and recommendations were discussed                            with the patient's family. [name], MD 05/18/2021 9:43:07 AM

## 2021-05-18 NOTE — Progress Notes (Signed)
Called to room to assist during endoscopic procedure.  Patient ID and intended procedure confirmed with present staff. Received instructions for my participation in the procedure from the performing physician.  

## 2021-05-18 NOTE — Patient Instructions (Signed)
Handouts given for polyps, diverticulosis, hemorrhoids and high fiber diet. Use FiberCon 1-2 tablets daily and drink plenty of water with them. Await pathology results. Resume previous medications and diet.   YOU HAD AN ENDOSCOPIC PROCEDURE TODAY AT [facility]:   Refer to the procedure report that was given to you for any specific questions about what was found during the examination.  If the procedure report does not answer your questions, please call your gastroenterologist to clarify.  If you requested that your care partner not be given the details of your procedure findings, then the procedure report has been included in a sealed envelope for you to review at your convenience later.  YOU SHOULD EXPECT: Some feelings of bloating in the abdomen. Passage of more gas than usual.  Walking can help get rid of the air that was put into your GI tract during the procedure and reduce the bloating. If you had a lower endoscopy (such as a colonoscopy or flexible sigmoidoscopy) you may notice spotting of blood in your stool or on the toilet paper. If you underwent a bowel prep for your procedure, you may not have a normal bowel movement for a few days.  Please Note:  You might notice some irritation and congestion in your nose or some drainage.  This is from the oxygen used during your procedure.  There is no need for concern and it should clear up in a day or so.  SYMPTOMS TO REPORT IMMEDIATELY:  Following lower endoscopy (colonoscopy or flexible sigmoidoscopy):  Excessive amounts of blood in the stool  Significant tenderness or worsening of abdominal pains  Swelling of the abdomen that is new, acute  Fever of 100F or higher  For urgent or emergent issues, a gastroenterologist can be reached at any hour by calling [phone]. Do not use MyChart messaging for urgent concerns.    DIET:  We do recommend a small meal at first, but then you may proceed to your regular diet.  Drink  plenty of fluids but you should avoid alcoholic beverages for 24 hours.  ACTIVITY:  You should plan to take it easy for the rest of today and you should NOT DRIVE or use heavy machinery until tomorrow (because of the sedation medicines used during the test).    FOLLOW UP: Our staff will call the number listed on your records 48-72 hours following your procedure to check on you and address any questions or concerns that you may have regarding the information given to you following your procedure. If we do not reach you, we will leave a message.  We will attempt to reach you two times.  During this call, we will ask if you have developed any symptoms of COVID 19. If you develop any symptoms (ie: fever, flu-like symptoms, shortness of breath, cough etc.) before then, please call [phone].  If you test positive for Covid 19 in the 2 weeks post procedure, please call and report this information to [place].    If any biopsies were taken you will be contacted by phone or by letter within the next 1-3 weeks.  Please call us at [phone] if you have not heard about the biopsies in 3 weeks.    SIGNATURES/CONFIDENTIALITY: You and/or your care partner have signed paperwork which will be entered into your electronic medical record.  These signatures attest to the fact that that the information above on your After Visit Summary has been reviewed and is understood.  Full responsibility of  the confidentiality of this discharge information lies with you and/or your care-partner.

## 2021-05-20 ENCOUNTER — Ambulatory Visit: Payer: BC Managed Care – PPO | Admitting: Podiatry

## 2021-05-20 ENCOUNTER — Ambulatory Visit (INDEPENDENT_AMBULATORY_CARE_PROVIDER_SITE_OTHER): Payer: BC Managed Care – PPO

## 2021-05-20 ENCOUNTER — Other Ambulatory Visit: Payer: Self-pay

## 2021-05-20 ENCOUNTER — Encounter: Payer: Self-pay | Admitting: Gastroenterology

## 2021-05-20 ENCOUNTER — Telehealth: Payer: Self-pay

## 2021-05-20 DIAGNOSIS — S99921A Unspecified injury of right foot, initial encounter: Secondary | ICD-10-CM

## 2021-05-20 DIAGNOSIS — S93691A Other sprain of right foot, initial encounter: Secondary | ICD-10-CM

## 2021-05-20 DIAGNOSIS — M7731 Calcaneal spur, right foot: Secondary | ICD-10-CM

## 2021-05-20 MED ORDER — MELOXICAM 15 MG PO TABS: 15.0000 mg | ORAL_TABLET | Freq: Every day | ORAL | 3 refills | Status: DC

## 2021-05-20 NOTE — Telephone Encounter (Signed)
  Follow up Call-  Call back number 05/18/2021  Post procedure Call Back phone  # [phone]  Permission to leave phone message Yes  Some recent data might be hidden     Patient questions:  Do you have a fever, pain , or abdominal swelling? No. Pain Score  0 *  Have you tolerated food without any problems? Yes.    Have you been able to return to your normal activities? Yes.    Do you have any questions about your discharge instructions: Diet   No. Medications  No. Follow up visit  No.  Do you have questions or concerns about your Care? No.  Actions: * If pain score is 4 or above: No action needed, pain <4.

## 2021-05-24 ENCOUNTER — Encounter: Payer: Self-pay | Admitting: Podiatry

## 2021-05-24 NOTE — Progress Notes (Signed)
  Subjective:  Patient ID: Erik Jones, male    DOB: 05/04/1956,  MRN: [id number]  Chief Complaint  Patient presents with   Foot Injury    Right foot injury    65 y.o. male presents with the above complaint. History confirmed with patient.  His foot slipped coming down a ladder the ladder fell and he landed on it hard with his foot on the rung.  It is healed hardens been painful.  Its improving quite a bit already.  Objective:  Physical Exam: warm, good capillary refill, no trophic changes or ulcerative lesions, normal DP and PT pulses, and normal sensory exam. Left Foot: normal exam, no swelling, tenderness, instability; ligaments intact, full range of motion of all ankle/foot joints Right Foot: point tenderness over the heel pad and point tenderness of the mid plantar fascia  No images are attached to the encounter.  Radiographs: Multiple views x-ray of the right foot: no fracture, dislocation, swelling or degenerative changes noted and plantar calcaneal spur Assessment:   1. Rupture of plantar fascia of right foot, initial encounter   2. Heel spur, right      Plan:  Patient was evaluated and treated and all questions answered.  Clinically his symptoms appear to be consistent with heel pain and I suspect he may have a partial rupture from his fall.  Discussed the etiology and treatment of these as well as further imaging such as an MRI and how this may affect her decision-making.  At this point he is starting to feel better already.  He will see me as needed.  I prescribed meloxicam for pain relief and he will begin therapeutic exercise at home.  No follow-ups on file.

## 2021-09-22 ENCOUNTER — Other Ambulatory Visit: Payer: Self-pay | Admitting: Podiatry

## 2021-09-24 DIAGNOSIS — K219 Gastro-esophageal reflux disease without esophagitis: Secondary | ICD-10-CM | POA: Diagnosis not present

## 2021-09-24 DIAGNOSIS — E7800 Pure hypercholesterolemia, unspecified: Secondary | ICD-10-CM | POA: Diagnosis not present

## 2021-09-24 DIAGNOSIS — R7303 Prediabetes: Secondary | ICD-10-CM | POA: Diagnosis not present

## 2021-09-24 DIAGNOSIS — I10 Essential (primary) hypertension: Secondary | ICD-10-CM | POA: Diagnosis not present

## 2022-03-28 DIAGNOSIS — Z1389 Encounter for screening for other disorder: Secondary | ICD-10-CM | POA: Diagnosis not present

## 2022-03-28 DIAGNOSIS — N529 Male erectile dysfunction, unspecified: Secondary | ICD-10-CM | POA: Diagnosis not present

## 2022-03-28 DIAGNOSIS — M1990 Unspecified osteoarthritis, unspecified site: Secondary | ICD-10-CM | POA: Diagnosis not present

## 2022-03-28 DIAGNOSIS — Z0000 Encounter for general adult medical examination without abnormal findings: Secondary | ICD-10-CM | POA: Diagnosis not present

## 2022-03-28 DIAGNOSIS — I10 Essential (primary) hypertension: Secondary | ICD-10-CM | POA: Diagnosis not present

## 2022-03-28 DIAGNOSIS — R7303 Prediabetes: Secondary | ICD-10-CM | POA: Diagnosis not present

## 2022-03-28 DIAGNOSIS — E7800 Pure hypercholesterolemia, unspecified: Secondary | ICD-10-CM | POA: Diagnosis not present

## 2022-03-28 DIAGNOSIS — Z125 Encounter for screening for malignant neoplasm of prostate: Secondary | ICD-10-CM | POA: Diagnosis not present

## 2022-09-30 DIAGNOSIS — I10 Essential (primary) hypertension: Secondary | ICD-10-CM | POA: Diagnosis not present

## 2022-09-30 DIAGNOSIS — E7800 Pure hypercholesterolemia, unspecified: Secondary | ICD-10-CM | POA: Diagnosis not present

## 2022-09-30 DIAGNOSIS — K219 Gastro-esophageal reflux disease without esophagitis: Secondary | ICD-10-CM | POA: Diagnosis not present

## 2022-09-30 DIAGNOSIS — R7303 Prediabetes: Secondary | ICD-10-CM | POA: Diagnosis not present

## 2022-11-04 ENCOUNTER — Other Ambulatory Visit: Payer: Self-pay | Admitting: Internal Medicine

## 2022-11-04 ENCOUNTER — Ambulatory Visit
Admission: RE | Admit: 2022-11-04 | Discharge: 2022-11-04 | Disposition: A | Discharge status: Home / Self Care | Payer: PPO | Source: Ambulatory Visit | Attending: Internal Medicine | Admitting: Internal Medicine

## 2022-11-04 DIAGNOSIS — J069 Acute upper respiratory infection, unspecified: Secondary | ICD-10-CM | POA: Diagnosis not present

## 2022-11-04 DIAGNOSIS — R0602 Shortness of breath: Secondary | ICD-10-CM | POA: Diagnosis not present

## 2022-11-04 DIAGNOSIS — Z03818 Encounter for observation for suspected exposure to other biological agents ruled out: Secondary | ICD-10-CM | POA: Diagnosis not present

## 2022-11-04 DIAGNOSIS — R059 Cough, unspecified: Secondary | ICD-10-CM | POA: Diagnosis not present

## 2022-11-09 ENCOUNTER — Encounter: Payer: Self-pay | Admitting: Podiatry

## 2023-04-05 DIAGNOSIS — G5600 Carpal tunnel syndrome, unspecified upper limb: Secondary | ICD-10-CM | POA: Diagnosis not present

## 2023-04-05 DIAGNOSIS — K219 Gastro-esophageal reflux disease without esophagitis: Secondary | ICD-10-CM | POA: Diagnosis not present

## 2023-04-05 DIAGNOSIS — Z0000 Encounter for general adult medical examination without abnormal findings: Secondary | ICD-10-CM | POA: Diagnosis not present

## 2023-04-05 DIAGNOSIS — R7303 Prediabetes: Secondary | ICD-10-CM | POA: Diagnosis not present

## 2023-04-05 DIAGNOSIS — Z125 Encounter for screening for malignant neoplasm of prostate: Secondary | ICD-10-CM | POA: Diagnosis not present

## 2023-04-05 DIAGNOSIS — E7800 Pure hypercholesterolemia, unspecified: Secondary | ICD-10-CM | POA: Diagnosis not present

## 2023-04-05 DIAGNOSIS — M19041 Primary osteoarthritis, right hand: Secondary | ICD-10-CM | POA: Diagnosis not present

## 2023-04-05 DIAGNOSIS — I10 Essential (primary) hypertension: Secondary | ICD-10-CM | POA: Diagnosis not present

## 2023-04-05 DIAGNOSIS — Z1331 Encounter for screening for depression: Secondary | ICD-10-CM | POA: Diagnosis not present

## 2023-05-17 DIAGNOSIS — D3612 Benign neoplasm of peripheral nerves and autonomic nervous system, upper limb, including shoulder: Secondary | ICD-10-CM | POA: Diagnosis not present

## 2023-05-17 DIAGNOSIS — L812 Freckles: Secondary | ICD-10-CM | POA: Diagnosis not present

## 2023-05-17 DIAGNOSIS — D1801 Hemangioma of skin and subcutaneous tissue: Secondary | ICD-10-CM | POA: Diagnosis not present

## 2023-05-17 DIAGNOSIS — L821 Other seborrheic keratosis: Secondary | ICD-10-CM | POA: Diagnosis not present

## 2023-05-17 DIAGNOSIS — L738 Other specified follicular disorders: Secondary | ICD-10-CM | POA: Diagnosis not present

## 2023-05-17 DIAGNOSIS — B078 Other viral warts: Secondary | ICD-10-CM | POA: Diagnosis not present
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
## 2023-05-30 ENCOUNTER — Telehealth: Payer: Self-pay | Admitting: Gastroenterology

## 2023-05-30 NOTE — Telephone Encounter (Signed)
The pt has been advised that based on research and his results from the last colon he is due 7 years from the last colon. He thanked me for calling and no further questions.

## 2023-05-30 NOTE — Telephone Encounter (Signed)
Inbound call from patient requesting a call to discuss 05/2021 colonoscopy results. Patient is requesting to know why his recall for colonoscopy has been changed to 7 years. Please advise, thank you.

## 2023-07-06 DIAGNOSIS — Z23 Encounter for immunization: Secondary | ICD-10-CM | POA: Diagnosis not present

## 2023-07-06 DIAGNOSIS — I10 Essential (primary) hypertension: Secondary | ICD-10-CM | POA: Diagnosis not present

## 2023-07-06 DIAGNOSIS — E7800 Pure hypercholesterolemia, unspecified: Secondary | ICD-10-CM | POA: Diagnosis not present

## 2023-07-06 DIAGNOSIS — K219 Gastro-esophageal reflux disease without esophagitis: Secondary | ICD-10-CM | POA: Diagnosis not present

## 2024-01-10 DIAGNOSIS — I10 Essential (primary) hypertension: Secondary | ICD-10-CM | POA: Diagnosis not present

## 2024-01-13 DIAGNOSIS — I10 Essential (primary) hypertension: Secondary | ICD-10-CM | POA: Diagnosis not present

## 2024-01-13 DIAGNOSIS — E7800 Pure hypercholesterolemia, unspecified: Secondary | ICD-10-CM | POA: Diagnosis not present

## 2024-01-13 DIAGNOSIS — M19041 Primary osteoarthritis, right hand: Secondary | ICD-10-CM | POA: Diagnosis not present

## 2024-02-08 DIAGNOSIS — I10 Essential (primary) hypertension: Secondary | ICD-10-CM | POA: Diagnosis not present

## 2024-02-12 DIAGNOSIS — M19041 Primary osteoarthritis, right hand: Secondary | ICD-10-CM | POA: Diagnosis not present

## 2024-02-12 DIAGNOSIS — E7800 Pure hypercholesterolemia, unspecified: Secondary | ICD-10-CM | POA: Diagnosis not present

## 2024-02-12 DIAGNOSIS — I10 Essential (primary) hypertension: Secondary | ICD-10-CM | POA: Diagnosis not present

## 2024-03-09 DIAGNOSIS — I10 Essential (primary) hypertension: Secondary | ICD-10-CM | POA: Diagnosis not present

## 2024-03-14 DIAGNOSIS — M19041 Primary osteoarthritis, right hand: Secondary | ICD-10-CM | POA: Diagnosis not present

## 2024-03-14 DIAGNOSIS — I10 Essential (primary) hypertension: Secondary | ICD-10-CM | POA: Diagnosis not present

## 2024-03-14 DIAGNOSIS — E7800 Pure hypercholesterolemia, unspecified: Secondary | ICD-10-CM | POA: Diagnosis not present

## 2024-04-08 DIAGNOSIS — I10 Essential (primary) hypertension: Secondary | ICD-10-CM | POA: Diagnosis not present

## 2024-04-10 DIAGNOSIS — R7303 Prediabetes: Secondary | ICD-10-CM | POA: Diagnosis not present

## 2024-04-10 DIAGNOSIS — M19041 Primary osteoarthritis, right hand: Secondary | ICD-10-CM | POA: Diagnosis not present

## 2024-04-10 DIAGNOSIS — I10 Essential (primary) hypertension: Secondary | ICD-10-CM | POA: Diagnosis not present

## 2024-04-10 DIAGNOSIS — Z125 Encounter for screening for malignant neoplasm of prostate: Secondary | ICD-10-CM | POA: Diagnosis not present

## 2024-04-10 DIAGNOSIS — E7800 Pure hypercholesterolemia, unspecified: Secondary | ICD-10-CM | POA: Diagnosis not present

## 2024-04-10 DIAGNOSIS — Z0000 Encounter for general adult medical examination without abnormal findings: Secondary | ICD-10-CM | POA: Diagnosis not present

## 2024-04-10 DIAGNOSIS — Z1331 Encounter for screening for depression: Secondary | ICD-10-CM | POA: Diagnosis not present

## 2024-04-14 DIAGNOSIS — M19041 Primary osteoarthritis, right hand: Secondary | ICD-10-CM | POA: Diagnosis not present

## 2024-04-14 DIAGNOSIS — I10 Essential (primary) hypertension: Secondary | ICD-10-CM | POA: Diagnosis not present

## 2024-04-14 DIAGNOSIS — E7800 Pure hypercholesterolemia, unspecified: Secondary | ICD-10-CM | POA: Diagnosis not present

## 2024-05-08 DIAGNOSIS — I10 Essential (primary) hypertension: Secondary | ICD-10-CM | POA: Diagnosis not present

## 2024-05-14 DIAGNOSIS — I10 Essential (primary) hypertension: Secondary | ICD-10-CM | POA: Diagnosis not present

## 2024-05-14 DIAGNOSIS — M19041 Primary osteoarthritis, right hand: Secondary | ICD-10-CM | POA: Diagnosis not present

## 2024-05-14 DIAGNOSIS — E7800 Pure hypercholesterolemia, unspecified: Secondary | ICD-10-CM | POA: Diagnosis not present

## 2024-06-07 DIAGNOSIS — I10 Essential (primary) hypertension: Secondary | ICD-10-CM | POA: Diagnosis not present

## 2024-06-14 DIAGNOSIS — I10 Essential (primary) hypertension: Secondary | ICD-10-CM | POA: Diagnosis not present

## 2024-06-14 DIAGNOSIS — E7800 Pure hypercholesterolemia, unspecified: Secondary | ICD-10-CM | POA: Diagnosis not present

## 2024-06-14 DIAGNOSIS — M19041 Primary osteoarthritis, right hand: Secondary | ICD-10-CM | POA: Diagnosis not present

## 2024-07-07 DIAGNOSIS — I10 Essential (primary) hypertension: Secondary | ICD-10-CM | POA: Diagnosis not present

## 2024-07-14 DIAGNOSIS — I10 Essential (primary) hypertension: Secondary | ICD-10-CM | POA: Diagnosis not present

## 2024-07-14 DIAGNOSIS — M19041 Primary osteoarthritis, right hand: Secondary | ICD-10-CM | POA: Diagnosis not present

## 2024-07-14 DIAGNOSIS — E7800 Pure hypercholesterolemia, unspecified: Secondary | ICD-10-CM | POA: Diagnosis not present
